# Patient Record
Sex: Male | Born: 1935 | Marital: Married
Health system: Southern US, Community
[De-identification: ages and names within clinical notes are randomized; demographics above are authoritative.]

---

## 2003-12-29 ENCOUNTER — Other Ambulatory Visit: Payer: Self-pay

## 2004-02-10 ENCOUNTER — Ambulatory Visit: Payer: Self-pay | Admitting: Internal Medicine

## 2004-08-28 ENCOUNTER — Emergency Department: Payer: Self-pay | Admitting: Emergency Medicine

## 2004-11-23 ENCOUNTER — Ambulatory Visit: Payer: Self-pay | Admitting: Gastroenterology

## 2004-11-28 ENCOUNTER — Ambulatory Visit: Payer: Self-pay | Admitting: Gastroenterology

## 2005-02-03 ENCOUNTER — Emergency Department: Payer: Self-pay | Admitting: Unknown Physician Specialty

## 2005-02-13 ENCOUNTER — Emergency Department: Payer: Self-pay | Admitting: Emergency Medicine

## 2005-07-29 ENCOUNTER — Emergency Department: Payer: Self-pay | Admitting: Emergency Medicine

## 2005-08-02 ENCOUNTER — Other Ambulatory Visit: Payer: Self-pay

## 2005-08-02 ENCOUNTER — Inpatient Hospital Stay: Payer: Self-pay | Admitting: Internal Medicine

## 2005-08-03 ENCOUNTER — Other Ambulatory Visit: Payer: Self-pay

## 2006-07-10 ENCOUNTER — Ambulatory Visit: Payer: Self-pay | Admitting: Internal Medicine

## 2006-08-05 ENCOUNTER — Ambulatory Visit: Payer: Self-pay | Admitting: Vascular Surgery

## 2006-11-17 ENCOUNTER — Ambulatory Visit: Payer: Self-pay | Admitting: Internal Medicine

## 2007-10-20 ENCOUNTER — Ambulatory Visit: Payer: Self-pay | Admitting: Urology

## 2008-11-11 IMAGING — US ULTRASOUND AORTA
1 series · 10 of 10 positions shown · non-contrast
Comparison: none

REASON FOR EXAM: Aorta
COMMENTS:

[Series 1: ultrasound aorta · 10 of 10 slices shown]
[im 1/10]
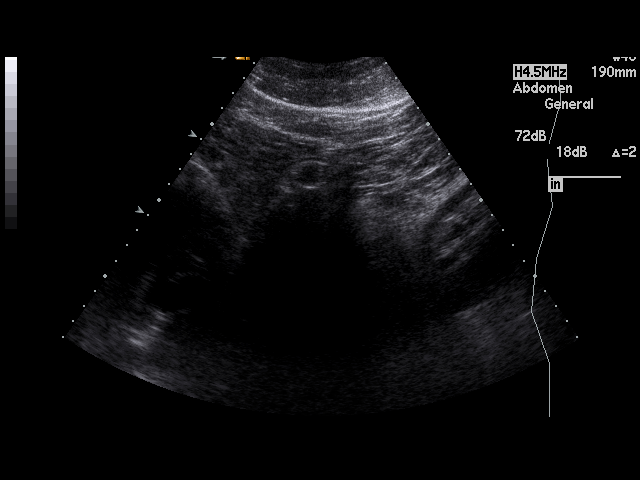
[im 2/10]
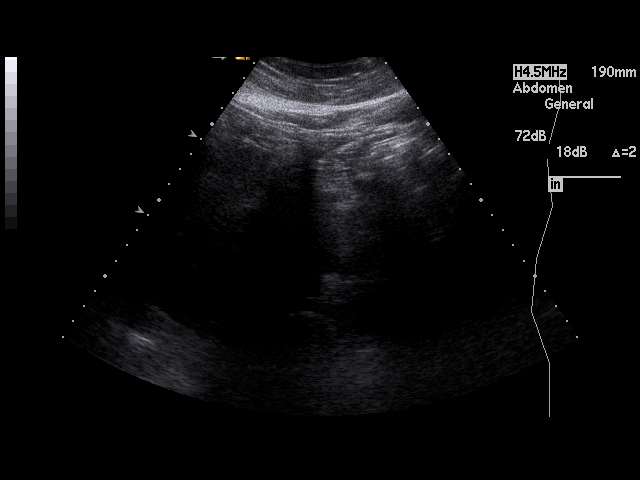
[im 3/10]
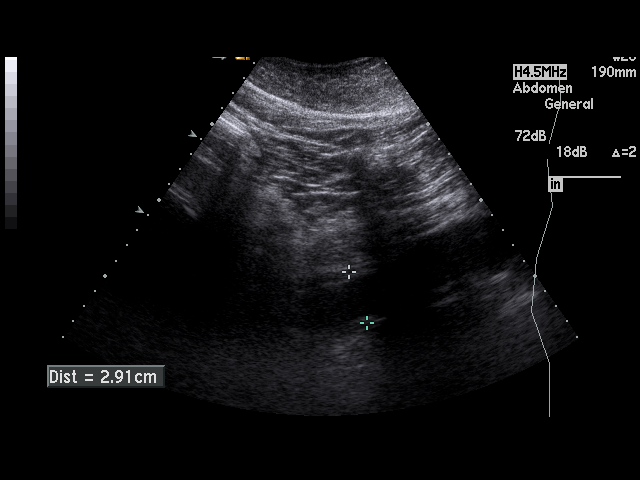
[im 4/10]
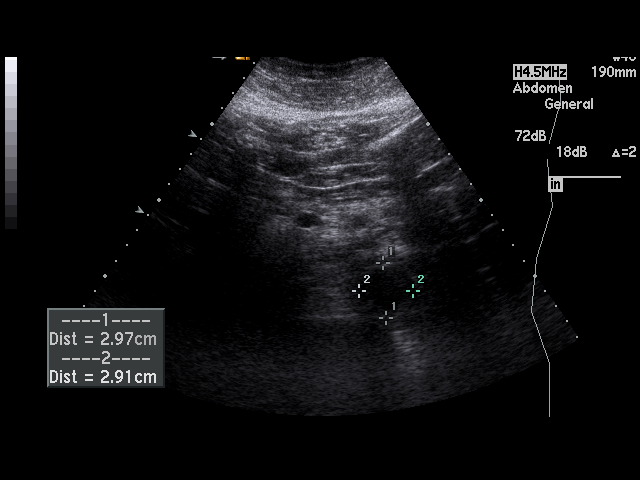
[im 5/10]
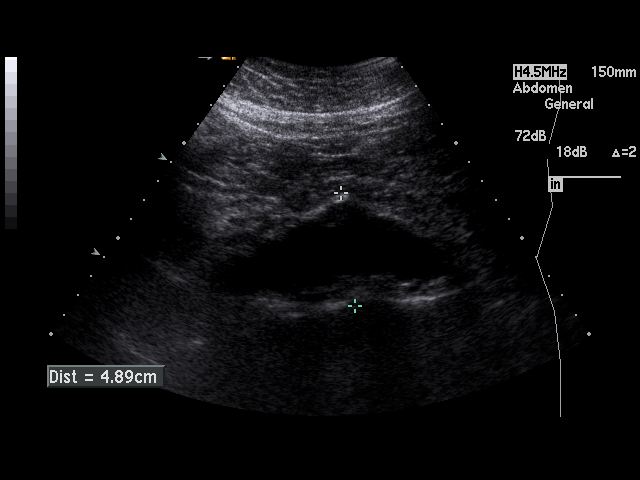
[im 6/10]
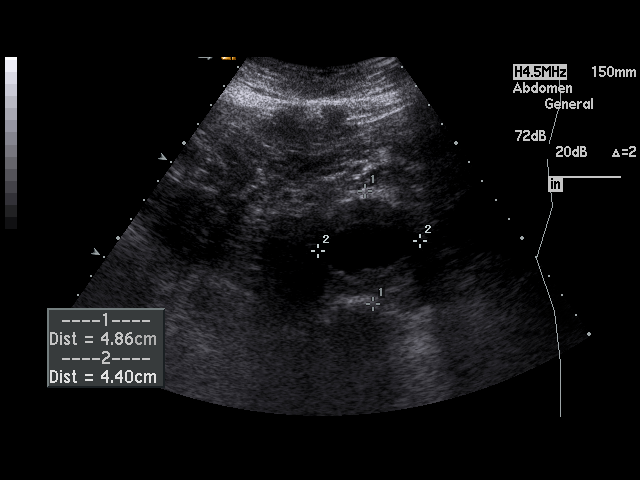
[im 7/10]
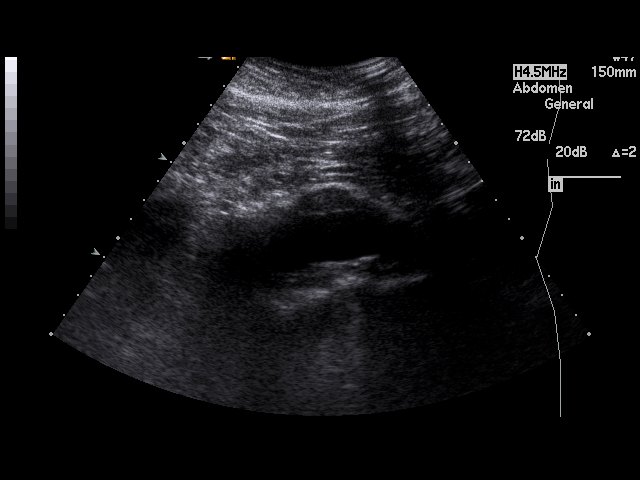
[im 8/10]
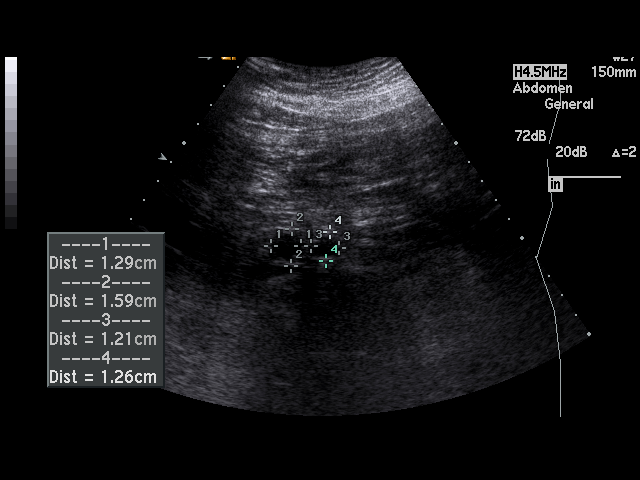
[im 9/10]
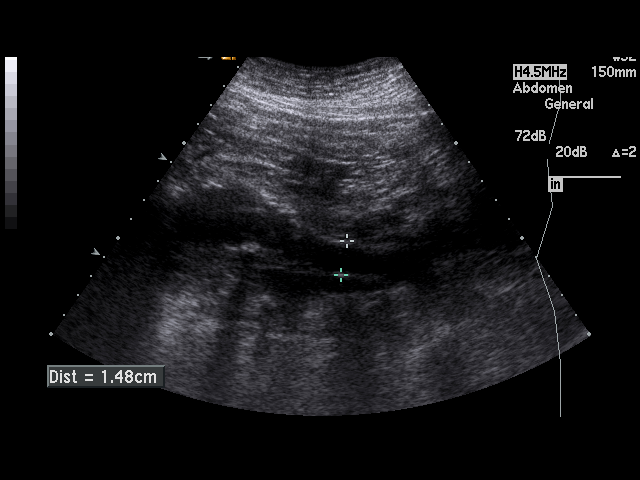
[im 10/10]
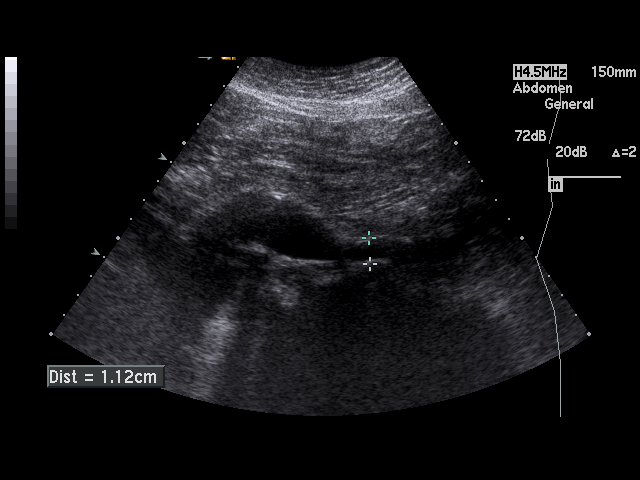

[10 of 10 positions shown; findings below may reference images not displayed]

PROCEDURE:     US  - US AORTA  - July 10, 2006  [DATE]

RESULT:     The patient underwent abdominal aortic ultrasound in July 2005
at which time the maximum measured dimension of the known aneurysm was 4 cm.
On today's study the maximum measured dimension was approximately 4.9 cm in
the AP plane. In the transverse plane it is approximately 4.4 cm.  The RIGHT
common iliac artery is minimally dilated at 1.5 cm and the LEFT common iliac
artery is relatively normal at 1.1 cm. There is mural thrombus within the
aortic aneurysm.
IMPRESSION: There has been interval increase in the size of the abdominal aortic
aneurysm such that it now measures approximately 4.9 cm in greatest
dimension. The aneurysm is predominantly infrarenal.

## 2008-11-18 ENCOUNTER — Inpatient Hospital Stay: Payer: Self-pay | Admitting: Specialist

## 2009-04-01 ENCOUNTER — Ambulatory Visit: Payer: Self-pay | Admitting: Family Medicine

## 2010-02-11 ENCOUNTER — Emergency Department: Payer: Self-pay | Admitting: Emergency Medicine

## 2011-03-24 ENCOUNTER — Inpatient Hospital Stay: Payer: Self-pay | Admitting: Internal Medicine

## 2012-06-10 ENCOUNTER — Emergency Department: Payer: Self-pay | Admitting: Emergency Medicine

## 2012-06-10 LAB — PRO B NATRIURETIC PEPTIDE: B-Type Natriuretic Peptide: 811 pg/mL — ABNORMAL HIGH (ref 0–450)

## 2012-06-10 LAB — COMPREHENSIVE METABOLIC PANEL
Anion Gap: 4 — ABNORMAL LOW (ref 7–16)
BUN: 25 mg/dL — ABNORMAL HIGH (ref 7–18)
Bilirubin,Total: 0.4 mg/dL (ref 0.2–1.0)
Calcium, Total: 8 mg/dL — ABNORMAL LOW (ref 8.5–10.1)
Creatinine: 1.91 mg/dL — ABNORMAL HIGH (ref 0.60–1.30)
EGFR (African American): 34 — ABNORMAL LOW
EGFR (Non-African Amer.): 29 — ABNORMAL LOW
Glucose: 104 mg/dL — ABNORMAL HIGH (ref 65–99)
SGOT(AST): 19 U/L (ref 15–37)

## 2012-06-10 LAB — CBC
MCH: 31.2 pg (ref 26.0–34.0)
MCV: 96 fL (ref 80–100)
Platelet: 152 10*3/uL (ref 150–440)
RBC: 3.44 10*6/uL — ABNORMAL LOW (ref 4.40–5.90)
RDW: 15.9 % — ABNORMAL HIGH (ref 11.5–14.5)

## 2012-06-10 LAB — TROPONIN I: Troponin-I: <0.02 ng/mL

## 2012-12-04 ENCOUNTER — Ambulatory Visit: Payer: Self-pay | Admitting: Family Medicine

## 2012-12-08 ENCOUNTER — Ambulatory Visit: Payer: Self-pay | Admitting: Internal Medicine

## 2012-12-25 ENCOUNTER — Inpatient Hospital Stay: Payer: Self-pay | Admitting: Internal Medicine

## 2012-12-25 LAB — COMPREHENSIVE METABOLIC PANEL
Albumin: 3 g/dL — ABNORMAL LOW (ref 3.4–5.0)
Anion Gap: 5 — ABNORMAL LOW (ref 7–16)
BUN: 22 mg/dL — ABNORMAL HIGH (ref 7–18)
Calcium, Total: 8.5 mg/dL (ref 8.5–10.1)
Creatinine: 1.63 mg/dL — ABNORMAL HIGH (ref 0.60–1.30)
Glucose: 104 mg/dL — ABNORMAL HIGH (ref 65–99)
SGOT(AST): 31 U/L (ref 15–37)
SGPT (ALT): 18 U/L (ref 12–78)
Sodium: 143 mmol/L (ref 136–145)

## 2012-12-25 LAB — URINALYSIS, COMPLETE
Glucose,UR: NEGATIVE mg/dL (ref 0–75)
Ketone: NEGATIVE
Leukocyte Esterase: NEGATIVE
Ph: 5 (ref 4.5–8.0)
Protein: 30
RBC,UR: 1 /HPF (ref 0–5)
Specific Gravity: 1.006 (ref 1.003–1.030)

## 2012-12-25 LAB — CBC
HGB: 10.8 g/dL — ABNORMAL LOW (ref 13.0–18.0)
MCV: 95 fL (ref 80–100)
Platelet: 129 10*3/uL — ABNORMAL LOW (ref 150–440)
RDW: 16.9 % — ABNORMAL HIGH (ref 11.5–14.5)
WBC: 5.2 10*3/uL (ref 3.8–10.6)

## 2012-12-25 LAB — PROTIME-INR
INR: 1.2
Prothrombin Time: 14.9 secs — ABNORMAL HIGH (ref 11.5–14.7)

## 2012-12-26 LAB — BASIC METABOLIC PANEL
Anion Gap: 8 (ref 7–16)
BUN: 23 mg/dL — ABNORMAL HIGH (ref 7–18)
Calcium, Total: 8.1 mg/dL — ABNORMAL LOW (ref 8.5–10.1)
Chloride: 108 mmol/L — ABNORMAL HIGH (ref 98–107)
EGFR (Non-African Amer.): 30 — ABNORMAL LOW
Glucose: 103 mg/dL — ABNORMAL HIGH (ref 65–99)
Osmolality: 287 (ref 275–301)
Potassium: 3.2 mmol/L — ABNORMAL LOW (ref 3.5–5.1)
Sodium: 142 mmol/L (ref 136–145)

## 2012-12-26 LAB — CBC WITH DIFFERENTIAL/PLATELET
Basophil #: 0.1 10*3/uL (ref 0.0–0.1)
Basophil %: 0.8 %
HCT: 29.7 % — ABNORMAL LOW (ref 40.0–52.0)
HGB: 10 g/dL — ABNORMAL LOW (ref 13.0–18.0)
MCV: 94 fL (ref 80–100)
Monocyte #: 0.5 x10 3/mm (ref 0.2–1.0)
Monocyte %: 6.6 %
Neutrophil #: 5.3 10*3/uL (ref 1.4–6.5)
Platelet: 111 10*3/uL — ABNORMAL LOW (ref 150–440)
RDW: 16.9 % — ABNORMAL HIGH (ref 11.5–14.5)
WBC: 8.1 10*3/uL (ref 3.8–10.6)

## 2012-12-27 LAB — BASIC METABOLIC PANEL
Anion Gap: 5 — ABNORMAL LOW (ref 7–16)
BUN: 25 mg/dL — ABNORMAL HIGH (ref 7–18)
Calcium, Total: 7.9 mg/dL — ABNORMAL LOW (ref 8.5–10.1)
Chloride: 113 mmol/L — ABNORMAL HIGH (ref 98–107)
Co2: 25 mmol/L (ref 21–32)
EGFR (African American): 35 — ABNORMAL LOW
EGFR (Non-African Amer.): 30 — ABNORMAL LOW
Glucose: 103 mg/dL — ABNORMAL HIGH (ref 65–99)
Potassium: 4 mmol/L (ref 3.5–5.1)

## 2012-12-27 LAB — CBC WITH DIFFERENTIAL/PLATELET
Eosinophil #: 0.1 10*3/uL (ref 0.0–0.7)
Eosinophil %: 1.1 %
HGB: 8.1 g/dL — ABNORMAL LOW (ref 13.0–18.0)
Lymphocyte #: 1.1 10*3/uL (ref 1.0–3.6)
Lymphocyte %: 13.4 %
MCH: 30.8 pg (ref 26.0–34.0)
MCHC: 32.6 g/dL (ref 32.0–36.0)
MCV: 95 fL (ref 80–100)
Monocyte %: 7.4 %
Neutrophil %: 77.6 %
Platelet: 98 10*3/uL — ABNORMAL LOW (ref 150–440)
RBC: 2.61 10*6/uL — ABNORMAL LOW (ref 4.40–5.90)
RDW: 17.1 % — ABNORMAL HIGH (ref 11.5–14.5)
WBC: 8 10*3/uL (ref 3.8–10.6)

## 2012-12-27 LAB — HEMOGLOBIN: HGB: 7.9 g/dL — ABNORMAL LOW (ref 13.0–18.0)

## 2012-12-28 DIAGNOSIS — I499 Cardiac arrhythmia, unspecified: Secondary | ICD-10-CM

## 2012-12-28 LAB — BASIC METABOLIC PANEL
BUN: 35 mg/dL — ABNORMAL HIGH (ref 7–18)
BUN: 38 mg/dL — ABNORMAL HIGH (ref 7–18)
Calcium, Total: 8.1 mg/dL — ABNORMAL LOW (ref 8.5–10.1)
Calcium, Total: 8.2 mg/dL — ABNORMAL LOW (ref 8.5–10.1)
Chloride: 109 mmol/L — ABNORMAL HIGH (ref 98–107)
Co2: 22 mmol/L (ref 21–32)
Co2: 22 mmol/L (ref 21–32)
Creatinine: 2.6 mg/dL — ABNORMAL HIGH (ref 0.60–1.30)
EGFR (African American): 26 — ABNORMAL LOW
EGFR (African American): 23 — ABNORMAL LOW
EGFR (Non-African Amer.): 20 — ABNORMAL LOW
Glucose: 110 mg/dL — ABNORMAL HIGH (ref 65–99)
Glucose: 124 mg/dL — ABNORMAL HIGH (ref 65–99)
Osmolality: 288 (ref 275–301)
Osmolality: 286 (ref 275–301)
Potassium: 4 mmol/L (ref 3.5–5.1)
Sodium: 140 mmol/L (ref 136–145)
Sodium: 138 mmol/L (ref 136–145)

## 2012-12-28 LAB — CK TOTAL AND CKMB (NOT AT ARMC)
CK, Total: 1464 U/L — ABNORMAL HIGH (ref 35–232)
CK-MB: 282.3 ng/mL — ABNORMAL HIGH (ref 0.5–3.6)
CK-MB: 210.2 ng/mL — ABNORMAL HIGH (ref 0.5–3.6)

## 2012-12-28 LAB — TROPONIN I: Troponin-I: >40 ng/mL

## 2012-12-28 LAB — PHOSPHORUS: Phosphorus: 3.5 mg/dL (ref 2.5–4.9)

## 2012-12-28 LAB — HEMOGLOBIN: HGB: 8.8 g/dL — ABNORMAL LOW (ref 13.0–18.0)

## 2012-12-28 LAB — MAGNESIUM: Magnesium: 1.7 mg/dL — ABNORMAL LOW

## 2012-12-29 LAB — BASIC METABOLIC PANEL
Anion Gap: 9 (ref 7–16)
BUN: 45 mg/dL — ABNORMAL HIGH (ref 7–18)
Calcium, Total: 8 mg/dL — ABNORMAL LOW (ref 8.5–10.1)
Co2: 23 mmol/L (ref 21–32)
EGFR (African American): 22 — ABNORMAL LOW
Glucose: 114 mg/dL — ABNORMAL HIGH (ref 65–99)
Osmolality: 290 (ref 275–301)
Potassium: 4.3 mmol/L (ref 3.5–5.1)
Sodium: 139 mmol/L (ref 136–145)

## 2012-12-29 LAB — HEMOGLOBIN: HGB: 8.3 g/dL — ABNORMAL LOW (ref 13.0–18.0)

## 2012-12-29 LAB — TROPONIN I: Troponin-I: >40 ng/mL

## 2012-12-29 LAB — CK TOTAL AND CKMB (NOT AT ARMC)
CK, Total: 1353 U/L — ABNORMAL HIGH (ref 35–232)
CK-MB: 177.1 ng/mL — ABNORMAL HIGH (ref 0.5–3.6)

## 2012-12-30 LAB — CBC WITH DIFFERENTIAL/PLATELET
Basophil #: 0 10*3/uL (ref 0.0–0.1)
Basophil %: 0.1 %
Eosinophil #: 0 10*3/uL (ref 0.0–0.7)
Eosinophil %: 0.3 %
HCT: 23.6 % — ABNORMAL LOW (ref 40.0–52.0)
HGB: 8 g/dL — ABNORMAL LOW (ref 13.0–18.0)
Lymphocyte #: 1 10*3/uL (ref 1.0–3.6)
Monocyte #: 0.6 x10 3/mm (ref 0.2–1.0)
Monocyte %: 6.3 %
Neutrophil #: 7.4 10*3/uL — ABNORMAL HIGH (ref 1.4–6.5)
Neutrophil %: 82.6 %
RDW: 17.1 % — ABNORMAL HIGH (ref 11.5–14.5)

## 2012-12-30 LAB — BASIC METABOLIC PANEL
Anion Gap: 8 (ref 7–16)
BUN: 55 mg/dL — ABNORMAL HIGH (ref 7–18)
Calcium, Total: 8 mg/dL — ABNORMAL LOW (ref 8.5–10.1)
Chloride: 108 mmol/L — ABNORMAL HIGH (ref 98–107)
Co2: 21 mmol/L (ref 21–32)
EGFR (Non-African Amer.): 16 — ABNORMAL LOW
Osmolality: 289 (ref 275–301)
Potassium: 4.4 mmol/L (ref 3.5–5.1)
Sodium: 137 mmol/L (ref 136–145)

## 2012-12-31 LAB — PATHOLOGY REPORT

## 2013-01-07 ENCOUNTER — Ambulatory Visit: Payer: Self-pay | Admitting: Internal Medicine

## 2013-01-07 DEATH — deceased

## 2014-07-30 NOTE — Op Note (Signed)
PATIENT NAME:  Omar Jensen, Omar Jensen MR#:  [id number] DATE OF BIRTH:  [birth year]  DATE OF PROCEDURE:  12/29/2012  PREOPERATIVE DIAGNOSIS: Left hip femoral neck fracture.   POSTOPERATIVE DIAGNOSIS: Left hip femoral neck fracture.   PROCEDURE PERFORMED: Left hip hemiarthroplasty.   SURGEON: Murlean HarkShalini Jamey Harman M.D.   ASSISTANT: None.   ESTIMATED BLOOD LOSS: 300 mL.   ANESTHESIA: Spinal anesthesia and sedation.   COMPLICATIONS: No immediate intraoperative or postoperative complications noted.   SURGICAL FINDINGS: Comminuted femoral neck fracture with significant loss of femoral neck height; dysplastic femoral head.   DISPOSITION: The patient will be weight-bearing as tolerated. He will be placed on Lovenox. He will see physical therapy the day after surgery. He will have six weeks of hip flexion precautions.   INDICATIONS FOR PROCEDURE: Omar Jensen is a 81 year old gentleman who was at home with his wife. He was walking out his front door and fell. He sustained a femoral neck fracture of the left hip. Discussion was had with the patient and his wife regarding surgical intervention. After a full explanation of procedure, risks, benefits, the decision was made to proceed with surgery.   DESCRIPTION OF PROCEDURE: Omar Jensen was identified in the preoperative holding area. Left hip was marked as the operative site. The patient was brought into the operating room and placed on the table in a supine position. The patient was transferred to a lateral position and spinal anesthesia was administered. The patient was then transferred to the operating room table and secured in the lateral position with adequate padding of all bony prominences and placement of an axillary roll.   Left lower extremity was prepared and draped in the usual sterile fashion. Surgical timeout was performed identifying the patient, procedure, laterality, confirming antibiotics, confirming imaging studies and confirming consent form and  surgical site markings.   Anatomic landmarks were palpated and marked. Standard posterolateral incision was made. Sharp dissection was carried down through skin and subcutaneous tissue. Sharp dissection was carried down through the tensor fascia lata and blunt dissection was used to split the gluteus. The leg was internally rotated and bursal tissue was removed. Short external rotators were identified. The patient had significant wasting of the short external rotators. The piriformis muscle was readily palpable. The piriformis was transected off the posterior aspect of the trochanter and tagged. Capsule was completely split and hemorrhagic. The femoral head was unable to be removed and therefore femoral neck cut was made as there was 1 large spike preventing removal of the femoral head. The neck fracture was found to be quite low along the anterior aspect going all the way down to the calcar. Femoral head was removed. The femoral head was found to be quite dysplastic and measured 57 mm. Acetabulum and the surgical field were copiously irrigated to remove all remnants of bone debris. At this time, femoral heads were trialed in the socket and a 56 mm head was found to have the most adequate fit.   At this time, femoral canal preparation was started. A box cutter was used to open the femoral canal. A canal finder was then inserted followed by a lateralizer. Sequential broaching of the femur was undertaken starting at a size 1 broach and ultimately completing a size 7. With a size 7 stem, multiple trials of femoral neck and head were inserted and the decision was made to use a plus 8.5 mm femoral head with a 56 mm outer diameter. The trial components were reduced and the hip was taken  through a range of motion. It was found to have adequate stability and  equal leg lengths. Trial components were removed. The femoral canal and acetabulum were copiously irrigated. Femoral canal was packed with Neo-Synephrine-soaked  packing. Cement was mixed. Upon mixing of cement, the packing was removed and the canal was dried. A size 4 cement restrictor was inserted into the femoral canal, and at this time, a Summit basic cemented stem, size 7, with a 13 mm stem centralizer was set up. Cement was inserted into the canal and pressurized. The femoral stem was inserted and adequate anteversion was confirmed. Stem was held in proper place until cement was completely dried. Excess cement was removed. When the cement was completely dry, the acetabulum was irrigated and soft tissue was checked for any remnants of cement.   At this time another trial was done using trial components and, again, the decision was made to proceed with plus 8.5 and 56 mm. Implants were opened. A plus 8.5 and 28 mm femoral head with a bipolar self-centering inner diameter 28, outer diameter 56 mm bipolar head was inserted and impacted on the femoral stem. Hip was reduced and found to have excellent stability and range of motion.   At this time, all soft tissues were copiously irrigated. Hemostasis was achieved. Hemovac drain was placed. Short external rotator was reattached to the posterior aspect of the greater trochanter. Remnants of capsule were attempted to be reapproximated prior to short external rotator repair. Fascia was closed using #1 Vicryl suture. Subcutaneous tissue was closed using 2-0 Vicryl suture. Skin was closed using staples. Sterile dressings were applied. Abduction pillow was placed. The patient will be weight-bearing as tolerated. He will be placed on Lovenox. He will have hip flexion precautions for 6 weeks.    ____________________________ Murlean Hark, MD sr:np D: 12/29/2012 16:41:31 ET T: 12/29/2012 20:14:35 ET JOB#: [id number]  cc: Murlean Hark, MD, <Dictator> Murlean Hark MD ELECTRONICALLY SIGNED 01/06/2013 15:18

## 2014-07-30 NOTE — Consult Note (Signed)
PATIENT NAME:  Omar Jensen, Omar Jensen MR#:  [id number] DATE OF BIRTH:  July 11, 1913  DATE OF CONSULTATION:  12/25/2012  REFERRING PHYSICIAN:  Enid Baas, MD  CONSULTING PHYSICIAN:  Marcina Millard, MD PRIMARY CARE PHYSICIAN: Jorje Guild. Beckey Downing, MD   CHIEF COMPLAINT: Fractured left hip.  HISTORY OF PRESENT ILLNESS: The patient is a 81 year old gentleman referred for preoperative cardiovascular evaluation prior to left hip surgery. The patient was in his usual state of health and fell this morning and fractured his left hip. He presented to [facility] Room with x-ray showing left femoral neck fracture and is tentatively scheduled for surgery tomorrow on 12/26/2012. The patient has a history of coronary artery disease and chronic stable angina typically relieved with sublingual nitroglycerin. According to the wife, the patient has also had a diagnosis of congestive heart failure. He does have a history of deep venous thrombosis, previously on warfarin therapy. The patient also has chronic kidney disease with a baseline creatinine of 2.   PAST MEDICAL HISTORY: 1.  History of coronary artery disease with non-ST-elevation myocardial infarction treated medically. The patient does have chronic stable angina on sublingual nitroglycerin. 2.  Questionable history of congestive heart failure.  3.  Hypertension.  4.  Chronic kidney disease with baseline creatinine of 2. 5.  Abdominal aortic aneurysm.  6.  History of deep venous thrombosis, on warfarin.  7.  History of prostate cancer.   MEDICATIONS ON ADMISSION: Aspirin 325 mg daily, furosemide 40 mg daily, Imdur 30 mg daily, Toprol 100 mg daily, allopurinol 300 mg daily, sublingual nitroglycerin 0.4 mg q. 5 p.r.n., Flomax 0.4 mg daily.   SOCIAL HISTORY: The patient is married, lives with his wife. He denies tobacco abuse.   FAMILY HISTORY: No immediate family history of coronary artery disease or myocardial infarction.    REVIEW OF SYSTEMS:   CONSTITUTIONAL: No fever or chills.  EYES:  The patient has some blurry vision.  EARS:  The patient has significant hearing loss.  RESPIRATORY: The patient denies shortness of breath.  CARDIOVASCULAR: The patient does have chest pain relieved with sublingual nitroglycerin.  GASTROINTESTINAL: The patient has intermittent nausea, denies vomiting, diarrhea or constipation.  GENITOURINARY: The patient does have incontinence.  ENDOCRINE: No  polyuria or polydipsia.  HEMATOLOGICAL: No easy bruising or bleeding.  MUSCULOSKELETAL: The patient has a fracture of the left hip.  NEUROLOGICAL: The patient has no prior history of transient ischemic attack or CVA.  PSYCHOLOGICAL: No depression or anxiety.   PHYSICAL EXAMINATION: VITAL SIGNS: Blood pressure 150/72, pulse 67, respirations 18, temperature 98.7, pulse ox 91%.  HEENT: Pupils equal and reactive to light and accommodation.  NECK: Supple without thyromegaly.  LUNGS: Were clear.  CARDIOVASCULAR: Normal JVP. Normal PMI. Regular rate and rhythm. Normal S1, S2. No appreciable gallop, murmur or rub.  ABDOMEN: Soft and nontender.  EXTREMITIES: There is no cyanosis, clubbing or edema. Pulses were intact bilaterally.  MUSCULOSKELETAL: The patient has a left hip fracture.  NEUROLOGICAL: The patient is alert and oriented. The patient is extremely hard of hearing but appears to be answering questions appropriately. Motor and sensory are both grossly intact.   IMPRESSION:  A 81 year old gentleman with unclear history of underlying coronary artery disease, questionable history of congestive heart failure with history of chronic stable angina on sublingual nitroglycerin. The patient does have chronic kidney disease with baseline creatinine of 2. Based on the patient's advanced age, chronic kidney disease, history of exertional chest pain relieved with sublingual nitroglycerin, questionable prior history of myocardial  infarction, the patient would at least pose  a moderate risk for serious cardiovascular complication during hip surgery.   RECOMMENDATIONS: 1.  Continue present meds.  2.  Obtain baseline EKG.  3.  Continue metoprolol pre-, peri and postoperatively.  4.  Would obtain postoperative EKG. 5.  No further noninvasive or invasive cardiac evaluation at this time.   ____________________________ Marcina MillardAlexander Majestic Molony, MD ap:cb D: 12/25/2012 17:07:08 ET T: 12/25/2012 17:32:48 ET JOB#: [id number]  cc: Marcina MillardAlexander Giordano Getman, MD, <Dictator> Marcina MillardALEXANDER Vista Sawatzky MD ELECTRONICALLY SIGNED 01/19/2013 11:24

## 2014-07-30 NOTE — Consult Note (Signed)
Brief Consult Note: Diagnosis: Left femoral neck fracture.   Comments: Consult cancelled - patient requesting Dr. Ernest PineHooten.  Came to see patient before being made aware of consult cancellation.  Patient fell while walking outside of his house.  He lives at home with wife.  81 years old.  ambulates with a cane. Denies LOC.  Complains of left hip pain.  PE: LLE examined.  TA, GS intact.  Sensation grossly intact to light touch.  DP palpable 1+  Slightly shortened and ER.  Skin abrasion at knee.  No skin breakdown at hip. Mild ecchymosis lateral aspect of buttock.  Imaging:  Displaced femoral neck fracture left hip.  DJD left hip.  Assessment: as above.  Plan: patient awaiting cardiology clearance for surgery.  Risks and benefits of surgery discussed.  Surgery recommended in order to promote earlier mobilization.  Patient and wife are aware this this is high risk given age of patient. Aware of likely functional decline to be expected.  They are requesting Dr. Ernest PineHooten for surgery.  I have made them aware of my comfort level and experience level.  I have reassured them that this is a common procedure. They are aware that Dr. Ernest PineHooten may be unavailable for surgery in a reasonable time-frame. I advised them that I will go ahead and schedule surgery for tomorrow pending medical clearance and that they can decide after discussion with Dr. Ernest PineHooten this evening.  Electronic Signatures: Murlean Harkamasunder, Brysen Shankman (MD)  (Signed 18-Sep-14 17:55)  Authored: Brief Consult Note   Last Updated: 18-Sep-14 17:55 by Murlean Harkamasunder, Sidni Fusco (MD)

## 2014-07-30 NOTE — Discharge Summary (Signed)
PATIENT NAME:  Omar Jensen, Omar W MR#:  [id number] DATE OF BIRTH:  [birth year]  DATE OF ADMISSION:  12/25/2012 DATE OF DISCHARGE:  12/30/2012  CONSULTATION: 1. Orthopedic consult with Dr. Claris Gladdenamasunder. 2. Palliative consult with Dr. Harvie JuniorPhifer. 3. Cardiology consult with Dr. Darrold JunkerParaschos.  4. Physical therapy consult.  DISCHARGE DIAGNOSES: 1. Left hip fracture status post left hip hemiarthroplasty. 2. Failure to thrive secondary to advanced age. 3. Dysphagia. 4. Acute ST elevation myocardial infarction. 5. History of coronary artery disease. 6. Acute on chronic renal failure. 7. History of deep vein thrombosis. 8. Benign prostatic hypertrophy. 9. Acute blood loss anemia.  DISPOSITION: To hospice home.  TIME SPENT ON DISCHARGE PREPARATION: More than 30 minutes.  HOSPITAL COURSE: The patient is a 81 year old male, fell down and had a hip fracture. The patient admitted to medical service, ortho was consulted. Refer to history and physical for full details. The patient's left hip x-ray showed acute fracture of the left femur. EKG showed sinus bradycardia with AV block, 53  beats per minute. The patient admitted to medical service. The patient seen by palliative care.  He developed MI postoperatively.troponin up to 40, seen by Dr. Evette GeorgesParashos. The patient right now is on aspirin, beta blockers. 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 It was felt that it was best for the patient to go to hospice home and discussed the plan with the wife.  TIME SPENT ON DISCHARGE PREPARATION: More than 30 minutes.   ____________________________ Katha HammingSnehalatha Shereta Crothers, MD sk:sg D: 12/30/2012 21:45:00 ET T: 12/31/2012 07:14:18 ET JOB#: [id number]  cc: Katha HammingSnehalatha Louine Tenpenny, MD, <Dictator> Katha HammingSNEHALATHA Ares Cardozo MD ELECTRONICALLY SIGNED 01/16/2013 14:55

## 2014-07-30 NOTE — H&P (Signed)
PATIENT NAME:  Omar Jensen, Omar Jensen MR#:  [id number] DATE OF BIRTH:  1914-03-06  DATE OF ADMISSION:  12/25/2012  ADMITTING PHYSICIAN: Gladstone Lighter, MD  PRIMARY CARE PHYSICIAN: Domenick Gong, MD  REASON FOR ADMISSION: Fall and left hip fracture.   HISTORY OF PRESENT ILLNESS: [name] is a 81 year old elderly Caucasian male with past medical history significant for hypertension, coronary artery disease, chronic kidney disease with baseline creatinine around 2, abdominal aortic aneurysm with no intervention, history of DVT, finished Coumadin treatment, and history of benign prostatic hypertrophy who usually ambulates at home with a cane presents to the hospital after he had a mechanical fall and x-rays here in the hospital reveal that he has a left hip fracture. The patient is extremely hard of hearing and his wife is at bedside and helps with most of the history. The patient has had heart disease with non-ST-segment elevation MI in the past and no intervention was done and according to them medical management was recommended. The patient does have a history of chronic angina and takes sublingual nitroglycerin from time to time. He has not used any sublingual nitro over the past couple of weeks and he denies any dyspnea on exertion. He had an echocardiogram done in 2004 which showed normal EF, but he is on low-dose Lasix at home for his chronic pedal edema. He has become more unsteady while walking over the past year and has had multiple falls at home, according to wife, but never this bad. This morning he was on his way to the lawnmower store and fell onto his back due to unsteadiness and complained of left hip pain and x-ray showed left femoral neck fracture so he is being admitted for the same.   PAST MEDICAL HISTORY: 1.  Coronary artery disease on medical management.  2.  CKD, baseline creatinine around 2.  3.  Abdominal aortic aneurysm with no intervention.  4.  History of DVT, previously on  Coumadin.  5.  History of prostate cancer.  6.  Benign prostatic hypertrophy. 7.  Hiatal hernia.  8.  Extremely hard of hearing.   PAST SURGICAL HISTORY: Lithotripsy for renal calculi.   ALLERGIES: No known drug allergies.   CURRENT HOME MEDICATIONS:  1.  Allopurinol 300 mg p.o. daily.  2.  Aspirin 325 mg p.o. daily.  3.  Finasteride 5 mg p.o. at bedtime.  4.  Lasix 40 mg p.o. daily.  5.  Imdur 30 mg p.o. daily.  6.  Toprol 100 mg p.o. at bedtime.  7.  Sublingual nitroglycerin 0.4 mg q. 5 minutes p.r.n. for chest pain.  8.  Flomax 0.4 mg p.o. daily.   SOCIAL HISTORY: Lives at home with his wife. No history of any smoking or alcohol use. Ambulates with a cane at baseline.   FAMILY HISTORY: Noncontributory at this point.   REVIEW OF SYSTEMS: CONSTITUTIONAL: No fever, fatigue or weakness.  EYES: Positive for blurred vision. No glaucoma or cataracts.  ENT: No tinnitus, ear pain. Positive for significant hearing loss. No epistaxis or discharge. Positive for some dysphagia, especially on the right side going on for a long time.  RESPIRATORY: No cough, wheeze, hemoptysis or COPD. CARDIOVASCULAR: No chest pain, orthopnea, edema, arrhythmia, palpitations or syncope.  GASTROINTESTINAL: No nausea, vomiting, diarrhea, abdominal pain, hematemesis or melena.  GENITOURINARY: Positive for incontinence at times. History of renal calculi and also hesitancy of urine.  ENDOCRINE: No polyuria, nocturia, thyroid problems, heat or cold intolerance.  HEMATOLOGY: No anemia, easy bruising or bleeding.  SKIN: No acne, rash or lesions.  MUSCULOSKELETAL: Positive for left hip pain. Positive for arthritis and gout.  NEUROLOGIC: No numbness, weakness, CVA, TIA or seizures.  PSYCHOLOGICAL: No anxiety, insomnia or depression.   PHYSICAL EXAMINATION: VITAL SIGNS: Temperature 97.8 degrees Fahrenheit, pulse 57, respirations 18, blood pressure 181/79 and pulse ox 95% on room air.  GENERAL: A thin built,  well-nourished male lying in bed, not in any acute distress.  HEENT: Normocephalic, atraumatic. Pupils equal, round and reacting to light. Anicteric sclerae. Extraocular movements intact. Oropharynx is clear without erythema, mass or exudate. NECK: Supple. No thyromegaly, JVD or carotid bruits. No lymphadenopathy.  LUNGS: Moving air bilaterally. No wheeze or crackles. No use of accessory muscles for breathing.  HEART:  S1 and S2 are present. Very soft S2 in the aortic and mitral area. Unable to hear any murmur. No rubs or gallops.  ABDOMEN: Soft, nontender, nondistended. No hepatosplenomegaly. Normal bowel sounds.  EXTREMITIES: There is 2+ pedal edema, which is chronic. No clubbing or cyanosis. 1+ dorsalis pedis pulses palpable bilaterally.  SKIN: No acne or rash. Poor skin turgor. There are small abrasions noted on the left ear and also on the left knee which are not actively bleeding at this time.  LYMPH:  No cervical lymphadenopathy.  NEUROLOGICAL: Cranial nerves II through XII remain grossly intact, except for the hearing. Deep tendon reflexes 1+ bilateral, symmetric. Strength is 5/5 in both upper extremities and right lower extremity. Left extremity strength is limited secondary to pain from his fracture. His limb is adducted and externally rotated on exam. Sensation is intact.  PSYCHOLOGICAL: The patient is awake, alert and oriented x 3.   LABORATORY AND DIAGNOSTICS: WBC is 5.3, hemoglobin 10.8, hematocrit 33.2, platelet count 129. Sodium 143, potassium 3.8, chloride 110, bicarb 28, BUN 22, creatinine 1.6, glucose 104 and calcium 8.5. ALT 18, AST 31, alk phos 70, total bilirubin 0.4, albumin 3.0.  Pelvic x-ray is showing acute fracture of left hip. No acute pelvic fracture seen.  Chest x-ray revealing no evidence of pneumonia or CHF. Mild hyperinflation reflecting COPD changes.   Left hip x-ray showing acute fracture of the neck of the left humerus in the subcapital region.   Left knee  x-ray showing no evidence of fracture of the left knee, moderate degenerative changes are seen in all 3 compartments.    EKG is showing sinus bradycardia, first degree AV block, heart rate of 53. No acute ST-T wave abnormalities.  ASSESSMENT AND PLAN: A 81 year old male with history of coronary artery disease, abdominal aortic aneurysm, benign prostatic hypertrophy, chronic kidney disease and history of deep venous thrombosis, previously on Coumadin, comes to the ER after he had a mechanical fall and left hip fracture is noted on the x-rays here. The patient ambulates with cane at baseline, but has been having more falls over the last year, but no fracture until now.  1.  Preoperative evaluation for hip fracture surgery. The patient is moderate risk for surgery with his heart disease, age, chronic kidney disease and has history of chronic angina as well. However, he denies any active symptoms over the past 2 weeks. No dyspnea on exertion, no chest pain and has been ambulating with his cane. He had an echocardiogram done from 2012 with normal EF. I would be comfortable if he can have a cardiology eval prior to the surgery and I have already spoke with Dr. Saralyn Pilar who will see the patient today. Continue Toprol although will decrease the dose because the patient  is bradycardic at this time. Hold off on aspirin because of his surgery and aspirin will be restarted after the surgery.  2.  Chronic kidney disease, stage III. Creatinine seems to be stable at this time. Lasix is being held, and he will be given gentle hydration for his surgery.  3.  Hypertension. Continue home medications.  4.  Abdominal aortic aneurysm. Monitor. No active intervention needed.  5.  Benign prostatic hypertrophy and history of prostate cancer. Continue Flomax and finasteride. Foley catheter will be placed per preoperative order.   6.  Left hip fracture surgery. Family prefers to have Dr. Marry Guan. Will notify ortho of the consult.  Pain management has been ordered and postop deep vein thrombosis prophylaxis, especially in patient with history of prior deep vein thrombosis.   CODE STATUS: FULL CODE.   TIME SPENT ADMISSION: 50 minutes.  ____________________________ Gladstone Lighter, MD rk:sb D: 12/25/2012 13:47:57 ET T: 12/25/2012 14:10:43 ET JOB#: [id number]  cc: Gladstone Lighter, MD, <Dictator> Fonnie Jarvis. Ilene Qua, MD Gladstone Lighter MD ELECTRONICALLY SIGNED 12/25/2012 16:00

## 2015-04-08 IMAGING — US ULTRASOUND AORTA
1 series · 14 of 24 positions shown · non-contrast
Comparison: none

REASON FOR EXAM: abd pain eval aorta abd aneurysm
COMMENTS:

[Series 1: ultrasound aorta · 0.31mm/px · 14 of 24 slices shown]
[im 1/24]
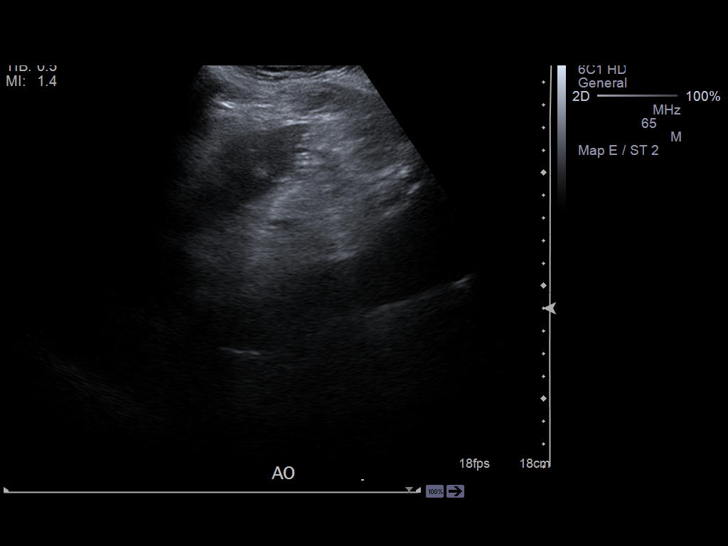
[im 3/24]
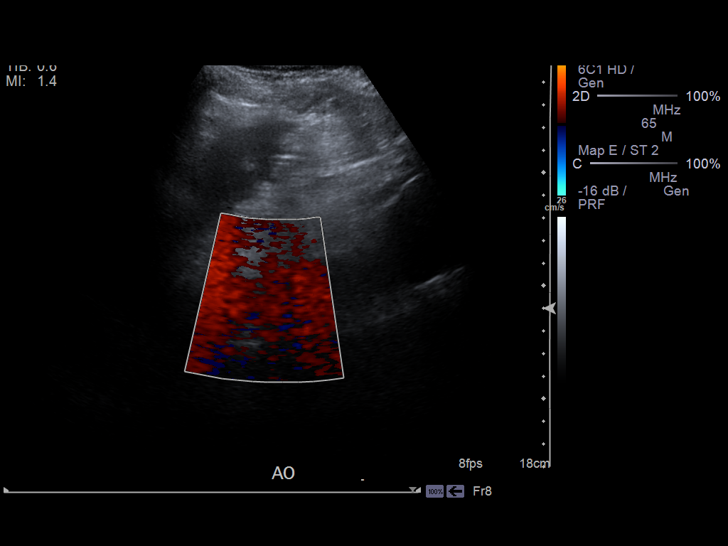
[im 5/24]
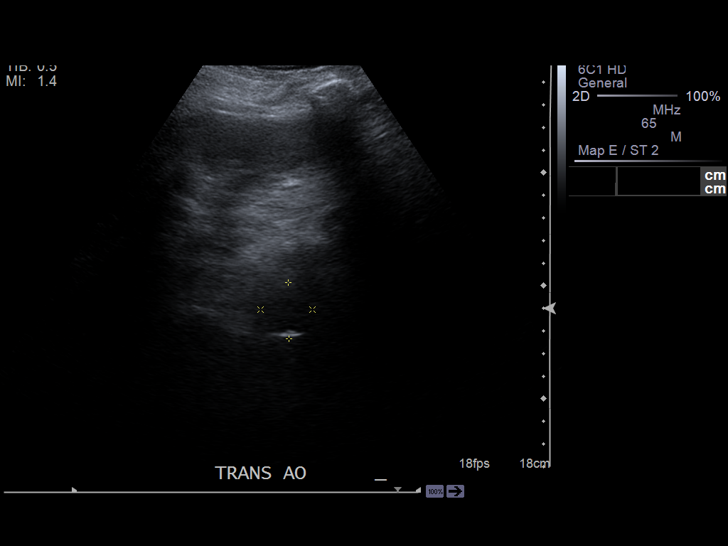
[im 7/24]
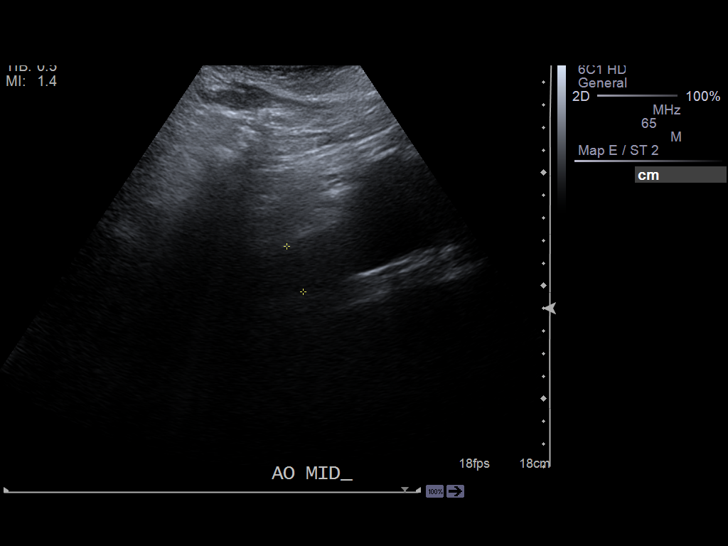
[im 8/24]
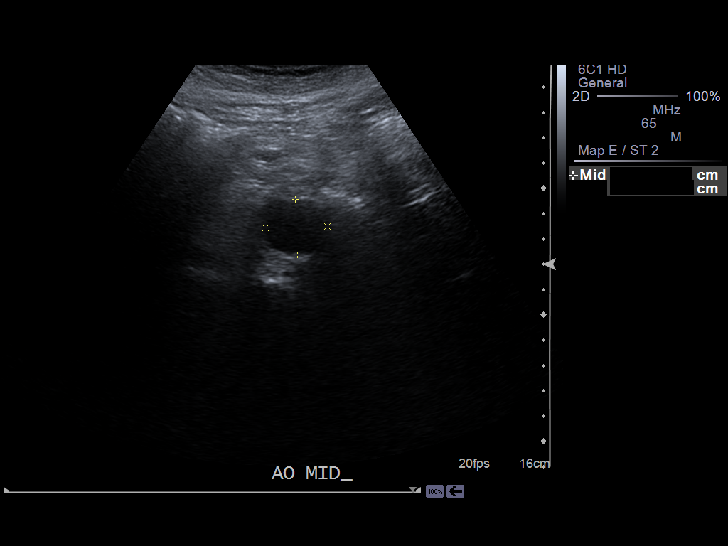
[im 10/24]
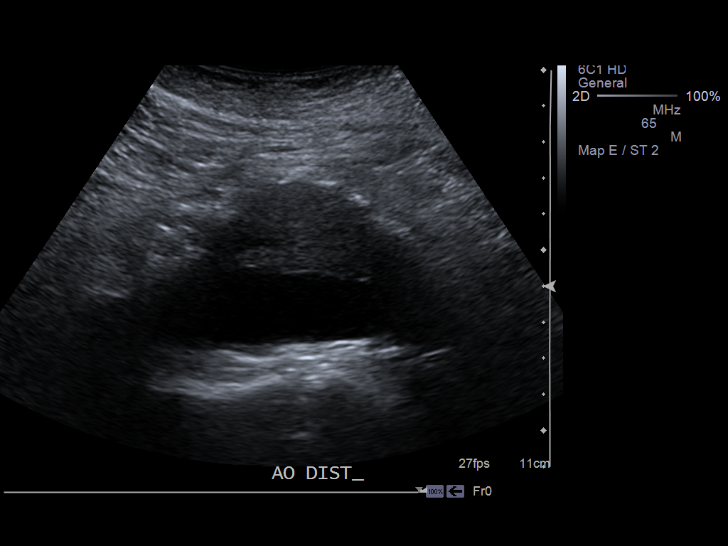
[im 12/24]
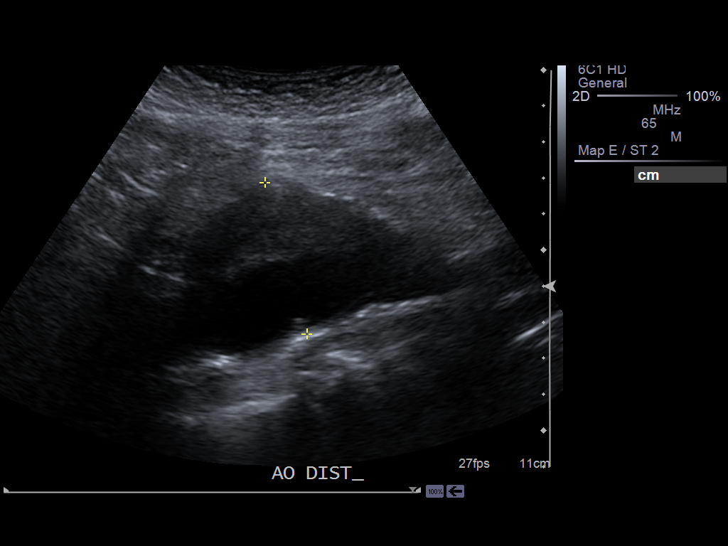
[im 13/24]
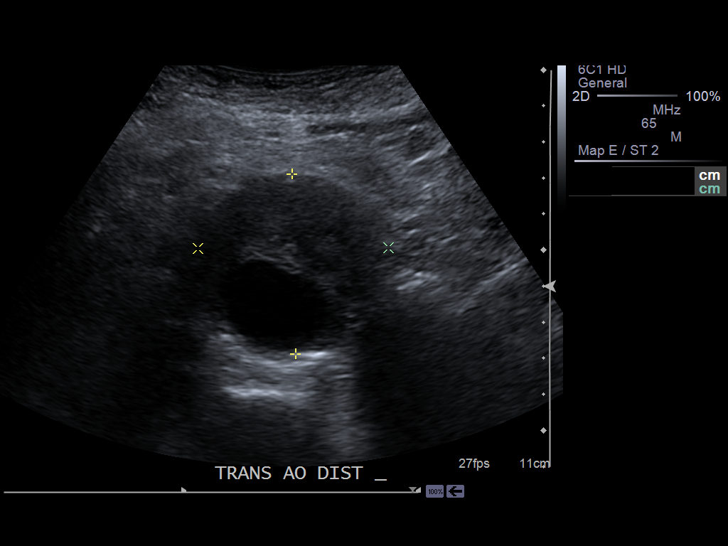
[im 15/24]
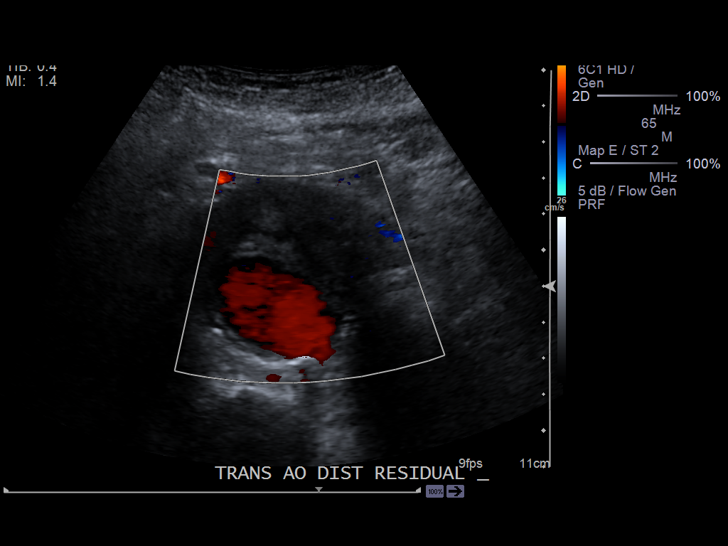
[im 17/24]
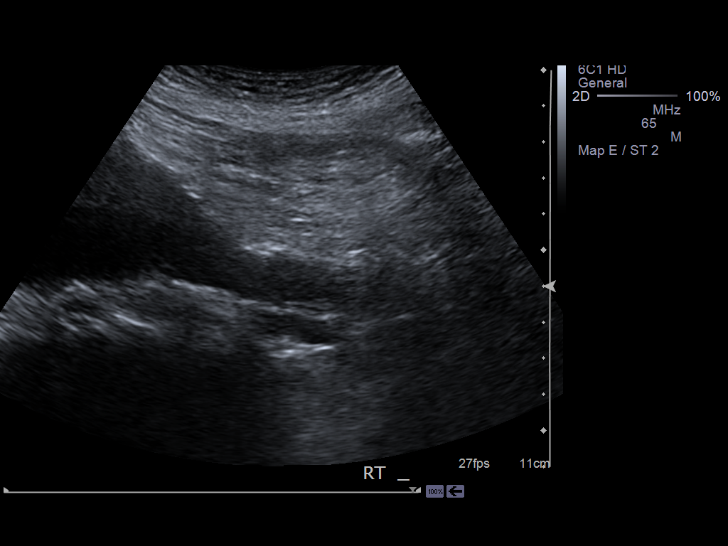
[im 19/24]
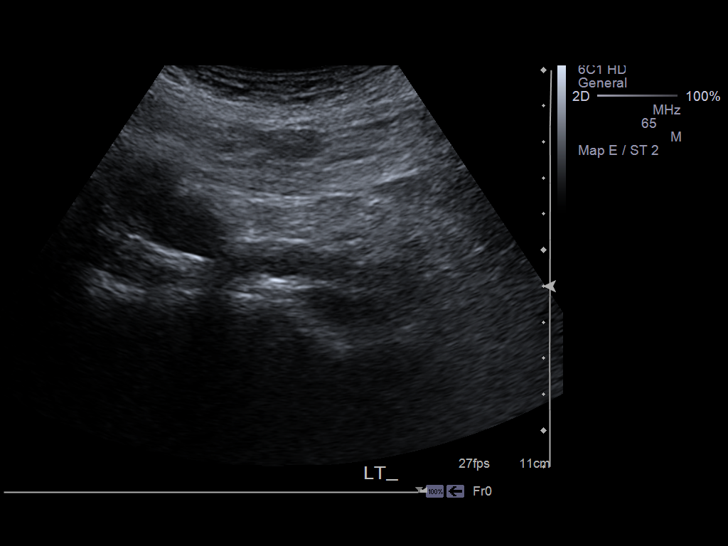
[im 20/24]
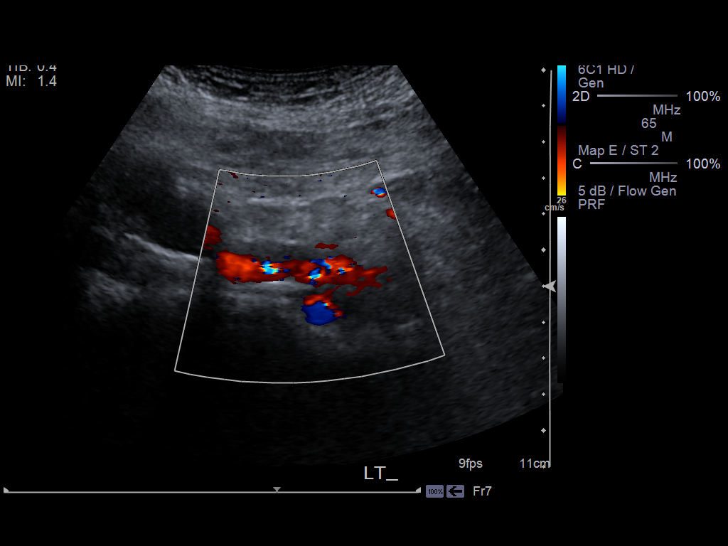
[im 22/24]
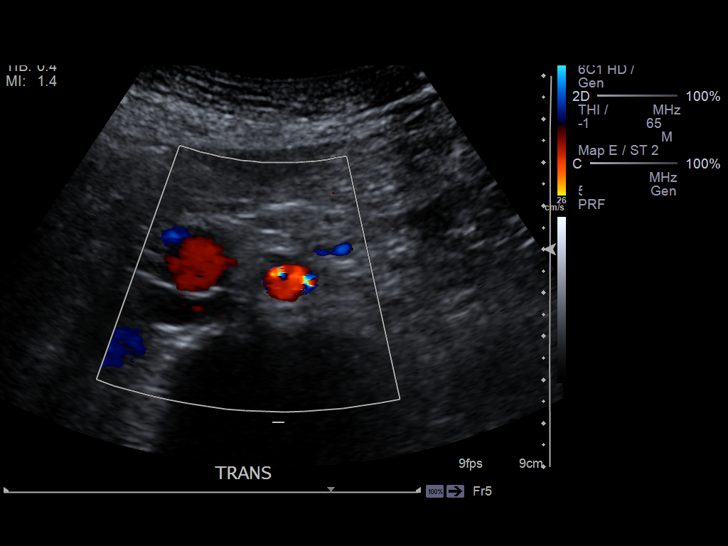
[im 24/24]
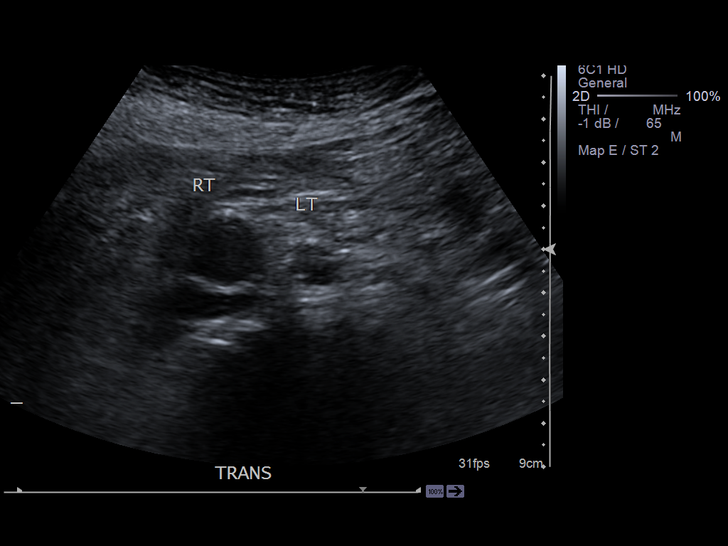

[14 of 24 positions shown; findings below may reference images not displayed]

PROCEDURE:     YUSHAU - YUSHAU AORTA  - December 04, 2012  [DATE]

RESULT:     Grayscale and color flow Doppler techniques were employed to
evaluate the abdominal aorta.

The proximal abdominal aorta measures 2.5 cm AP x 2.3 cm transversely. The
mid aorta measures 2.2 cm AP by 2.4 cm transversely.

There is an infrarenal abdominal aortic aneurysm measuring 7.5 cm
longitudinally. It exhibits a maximal AP dimension of 5 cm and maximal
transverse dimension of 5.3 cm. It does not appear to extend into the common
iliac arteries which measure 1.5 cm and 1.2 cm on the right and left
respectively.
IMPRESSION: There is an infrarenal abdominal aortic aneurysm containing
mural thrombus. Measures 5.3 cm in greatest dimension transversely which is
up from 4.4 cm in [id number]. It exhibits maximal AP dimension today of 5 cm which
was previously 4.9 cm in 8778.

[REDACTED]

## 2015-04-29 IMAGING — CR DG CHEST 1V
1 series · 2 of 2 positions shown · non-contrast
Comparison: none

REASON FOR EXAM: preoperative
COMMENTS:

[Series 1: ap · 0.17mm/px · 2 of 2 slices shown]
[im 1/2]
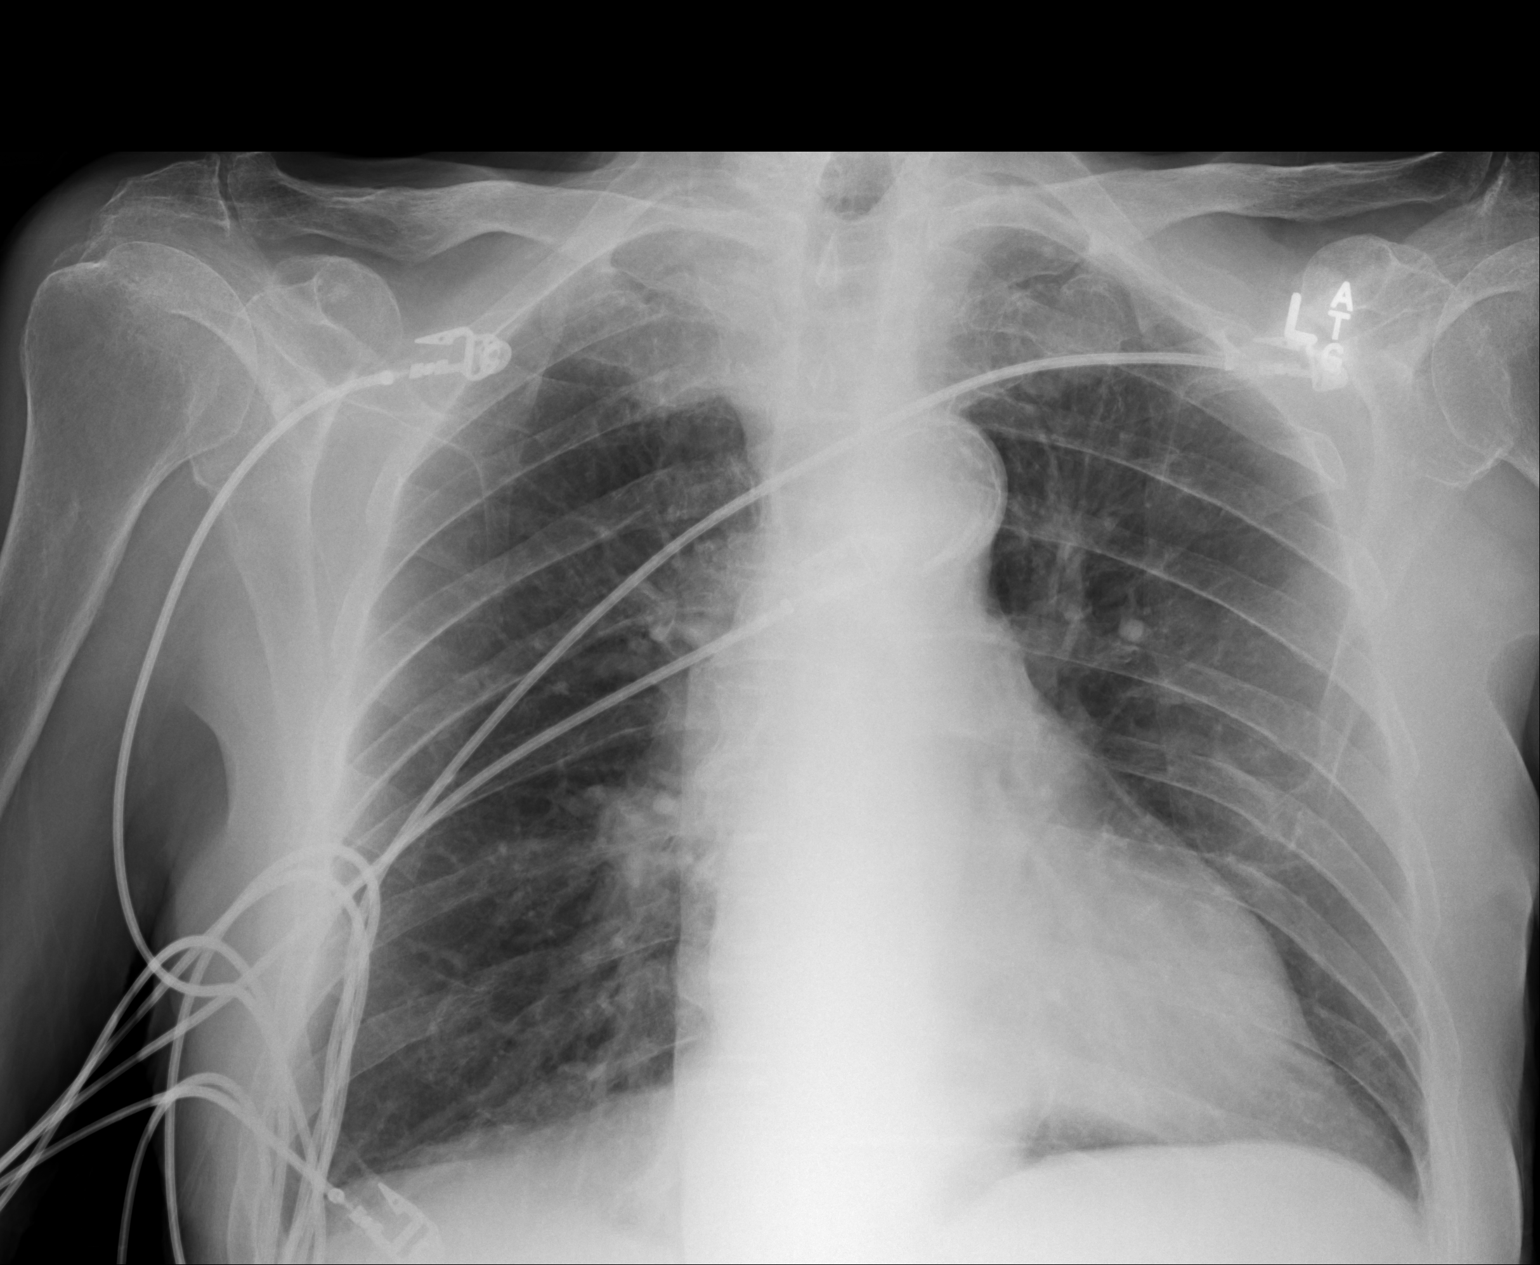
[im 2/2]
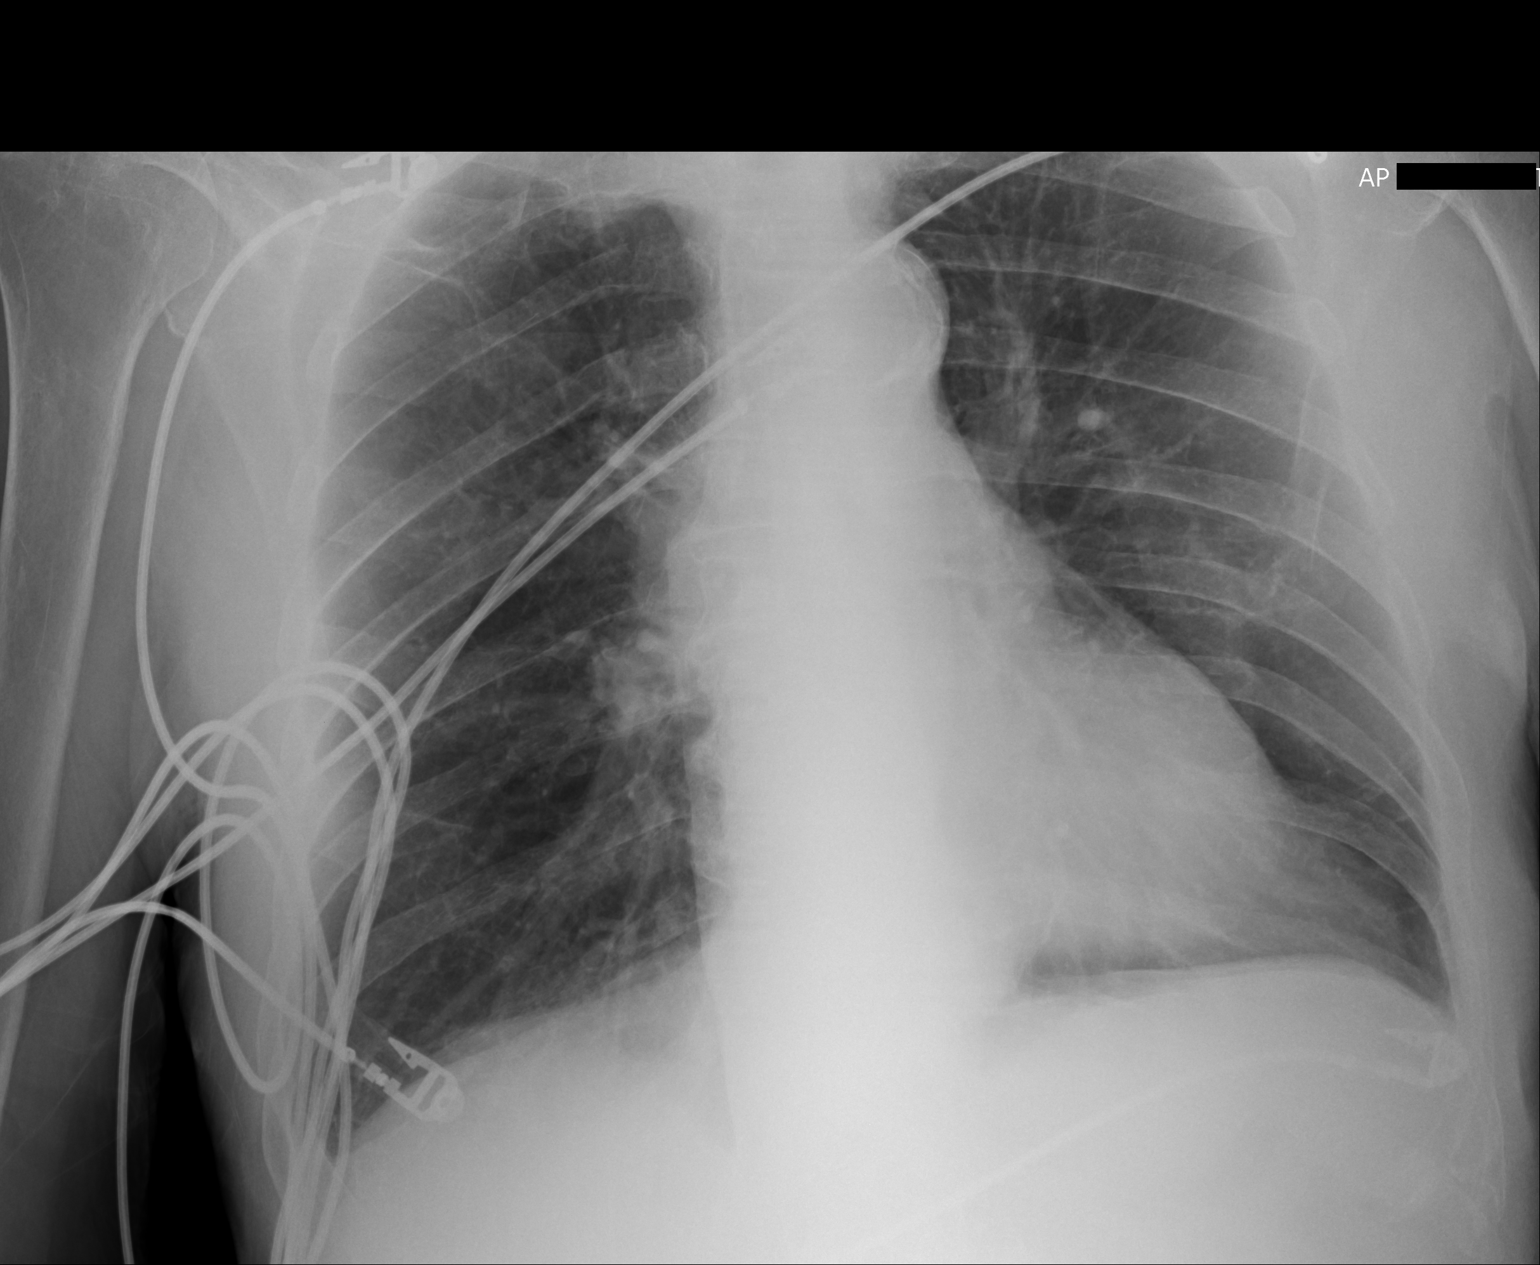

[2 of 2 positions shown; findings below may reference images not displayed]

PROCEDURE:     DXR - DXR CHEST 1 VIEWAP OR PA  - December 25, 2012 [DATE]

RESULT:     Comparison is made to the study June 10, 2012.

The lungs are mildly hyperinflated. There is no focal infiltrate. The
cardiac silhouette is top normal in size. The pulmonary vascularity is not
engorged. There is no pleural effusion or pneumothorax. The observed
portions of the bony thorax exhibit no acute abnormalities.
IMPRESSION: There is no evidence of pneumonia nor CHF. Mild
hyperinflation may reflect COPD.

[REDACTED]
